# Patient Record
Sex: Female | Born: 1990 | Race: White | Hispanic: No | Marital: Single
Health system: Southern US, Community
[De-identification: ages and names within clinical notes are randomized; demographics above are authoritative.]

---

## 2019-05-19 ENCOUNTER — Other Ambulatory Visit: Payer: Self-pay

## 2019-05-19 ENCOUNTER — Emergency Department (HOSPITAL_COMMUNITY): Payer: Medicaid Other

## 2019-05-19 ENCOUNTER — Emergency Department (HOSPITAL_COMMUNITY)
Admission: EM | Admit: 2019-05-19 | Discharge: 2019-05-19 | Disposition: A | Discharge status: Home / Self Care | Payer: Medicaid Other | Attending: Emergency Medicine | Admitting: Emergency Medicine

## 2019-05-19 DIAGNOSIS — M5489 Other dorsalgia: Secondary | ICD-10-CM | POA: Diagnosis not present

## 2019-05-19 DIAGNOSIS — R05 Cough: Secondary | ICD-10-CM | POA: Diagnosis not present

## 2019-05-19 DIAGNOSIS — Z20828 Contact with and (suspected) exposure to other viral communicable diseases: Secondary | ICD-10-CM | POA: Insufficient documentation

## 2019-05-19 DIAGNOSIS — R059 Cough, unspecified: Secondary | ICD-10-CM

## 2019-05-19 LAB — URINALYSIS, ROUTINE W REFLEX MICROSCOPIC
Bilirubin Urine: NEGATIVE
Glucose, UA: NEGATIVE mg/dL
Hgb urine dipstick: NEGATIVE
Ketones, ur: NEGATIVE mg/dL
Leukocytes,Ua: NEGATIVE
Nitrite: NEGATIVE
Protein, ur: NEGATIVE mg/dL
Specific Gravity, Urine: 1.004 — ABNORMAL LOW (ref 1.005–1.030)
pH: 6 (ref 5.0–8.0)

## 2019-05-19 MED ORDER — ALBUTEROL SULFATE HFA 108 (90 BASE) MCG/ACT IN AERS
2.0000 | INHALATION_SPRAY | RESPIRATORY_TRACT | Status: DC | PRN
  Administered 2019-05-19: 2 via RESPIRATORY_TRACT
  Filled 2019-05-19: qty 6.7

## 2019-05-19 MED ORDER — NAPROXEN 500 MG PO TABS: 500.0000 mg | ORAL_TABLET | Freq: Two times a day (BID) | ORAL | 0 refills | Status: AC | PRN

## 2019-05-19 NOTE — ED Provider Notes (Signed)
[facility]-EMERGENCY DEPT Provider Note   CSN: [id number] Arrival date & time: 05/19/19  0541    History   Chief Complaint Chief Complaint  Patient presents with  . Cough  . Back Pain    HPI Monique Jones is a 28 y.o. female.     The history is provided by the patient and medical records. No language interpreter was used.  Cough Associated symptoms: no chest pain, no chills, no fever, no headaches, no rash, no shortness of breath and no sore throat   Back Pain Associated symptoms: no abdominal pain, no chest pain, no dysuria, no fever and no headaches     Monique Jones is a 28 y.o. female who presents to the Emergency Department complaining of persistent, gradually worsening cough for the last 5 days.  She has tried over-the-counter cough medication with little improvement.  Denies any fever, shortness of breath or chest pain.  No known sick contacts or coronavirus exposures that she is aware of.  Denies any abdominal pain, nausea or vomiting.  She also endorses left-sided back pain for about 3 or 4 days now.  Denies any urinary symptoms or vaginal discharge.  Reports history of pyelonephritis in the past, but denies any chronic medical problems.  Specifically denies any past medical history other than kidney infection.  She takes no daily home medications.   No past medical history on file.  There are no active problems to display for this patient.    OB History   No obstetric history on file.      Home Medications    Prior to Admission medications   Medication Sig Start Date End Date Taking? Authorizing Provider  naproxen (NAPROSYN) 500 MG tablet Take 1 tablet (500 mg total) by mouth 2 (two) times daily as needed for mild pain or moderate pain. 05/19/19   Marliss Buttacavoli, Chase PicketJaime Pilcher, PA-C    Family History No family history on file.  Social History Social History   Tobacco Use  . Smoking status: Not on file  Substance Use Topics  . Alcohol use:  Not on file  . Drug use: Not on file     Allergies   Patient has no known allergies.   Review of Systems Review of Systems  Constitutional: Negative for chills and fever.  HENT: Negative for congestion and sore throat.   Respiratory: Positive for cough. Negative for shortness of breath.   Cardiovascular: Negative for chest pain.  Gastrointestinal: Negative for abdominal pain, diarrhea, nausea and vomiting.  Genitourinary: Negative for difficulty urinating, dysuria, flank pain, frequency, urgency and vaginal discharge.  Musculoskeletal: Positive for back pain. Negative for neck pain.  Skin: Negative for rash.  Allergic/Immunologic: Negative for immunocompromised state.  Neurological: Negative for headaches.     Physical Exam Updated Vital Signs BP 116/75 (BP Location: Left Arm)   Pulse 94   Temp 98.7 F (37.1 C) (Oral)   Resp 15   Ht 5\' 8"  (1.727 m)   Wt 81.1 kg   SpO2 97%   BMI 27.20 kg/m   Physical Exam Vitals signs and nursing note reviewed.  Constitutional:      General: She is not in acute distress.    Appearance: She is well-developed.  HENT:     Head: Normocephalic and atraumatic.     Mouth/Throat:     Pharynx: Oropharynx is clear.  Neck:     Musculoskeletal: Neck supple.  Cardiovascular:     Rate and Rhythm: Normal rate and regular rhythm.  Heart sounds: Normal heart sounds. No murmur.  Pulmonary:     Effort: Pulmonary effort is normal. No respiratory distress.     Breath sounds: Normal breath sounds.     Comments: Lungs clear to auscultation bilaterally. Abdominal:     General: There is no distension.     Palpations: Abdomen is soft.     Comments: No abdominal or flank tenderness.  Musculoskeletal:       Back:     Comments: No midline C/T/L-spine tenderness.  Bilateral lower extremities with 5/5 muscle strength, full range of motion and are neurovascularly intact.  Skin:    General: Skin is warm and dry.  Neurological:     Mental Status:  She is alert and oriented to person, place, and time.      ED Treatments / Results  Labs (all labs ordered are listed, but only abnormal results are displayed) Labs Reviewed  URINALYSIS, ROUTINE W REFLEX MICROSCOPIC - Abnormal; Notable for the following components:      Result Value   Color, Urine STRAW (*)    Specific Gravity, Urine 1.004 (*)    All other components within normal limits  NOVEL CORONAVIRUS, NAA (HOSPITAL ORDER, SEND-OUT TO REF LAB)    EKG None  Radiology Dg Chest Portable 1 View  Result Date: 05/19/2019 CLINICAL DATA:  Pt states that she is hungry and has been walking all day. She endorses a cough with green sputum. Also endorses back pain. EXAM: PORTABLE CHEST 1 VIEW COMPARISON:  None. FINDINGS: Cardiac silhouette is normal in size. No mediastinal or hilar masses. No evidence of adenopathy. Lungs are clear.  No pleural effusion or pneumothorax. Skeletal structures are unremarkable. IMPRESSION: No active disease. Electronically Signed   By: Lajean Manes M.D.   On: 05/19/2019 10:21    Procedures Procedures (including critical care time)  Medications Ordered in ED Medications  albuterol (VENTOLIN HFA) 108 (90 Base) MCG/ACT inhaler 2 puff (2 puffs Inhalation Given 05/19/19 1104)     Initial Impression / Assessment and Plan / ED Course  I have reviewed the triage vital signs and the nursing notes.  Pertinent labs & imaging results that were available during my care of the patient were reviewed by me and considered in my medical decision making (see chart for details).       Monique Jones is a 28 y.o. female who presents to ED for progressively worsening cough x 5 days. On exam, she is afebrile, non-toxic appearing, hemodynamically stable with clear lung exam. Oxygenating well on RA. CXR independently reviewed by me showing no acute cardiopulmonary disease. No PNA. Will treat with albuterol inhaler. PCP follow up. Patient additionally complaining of left sided  low back pain. Exam c/w msk etiology. No urinary symptoms, but does report having back pain in the past which felt similar when diagnosed with pyelonephritis. UA without signs of infection. No abdominal tenderness. No midline tenderness. No red flag symptoms of back pain. Will treat conservatively. Reasons to return to ER discussed. All questions answered.   Final Clinical Impressions(s) / ED Diagnoses   Final diagnoses:  Cough  Left paraspinal back pain    ED Discharge Orders         Ordered    naproxen (NAPROSYN) 500 MG tablet  2 times daily PRN     05/19/19 1232           Lachrista Heslin, Ozella Almond, PA-C 05/19/19 1239    Fredia Sorrow, MD 05/21/19 1536

## 2019-05-19 NOTE — Discharge Instructions (Signed)
It was my pleasure taking care of you today!   2 puffs of inhaler every 4-6 hours as needed for cough.   Naproxen as needed for pain.   You have been tested for coronavirus. Results should be available within 48 hours. You should get a phone call if positive. You can always log in to your mychart account to see results.   Follow up with primary care doctor.   Return to ER for new or worsening symptoms, any additional concerns.

## 2019-05-19 NOTE — ED Notes (Signed)
She rouses enough to cooperate with COVID testing; after which she falls back into a sound sleep. I just now arouse her again to inform her that her hot lunch is here; and again she falls back to sleep. Her skin is normal, warm and dry and she is breathing normally.

## 2019-05-19 NOTE — ED Triage Notes (Signed)
Pt states that she is stranded here for [facility] She states that she is hungry and has been walking all day. She endorses a cough with green sputum. Also endorses back pain. Hx of nephritis.

## 2019-05-20 LAB — NOVEL CORONAVIRUS, NAA (HOSP ORDER, SEND-OUT TO REF LAB; TAT 18-24 HRS): SARS-CoV-2, NAA: NOT DETECTED

## 2020-11-17 IMAGING — DX PORTABLE CHEST - 1 VIEW
1 series · 1 of 1 positions shown · non-contrast
Comparison: None.

CLINICAL DATA: Pt states that she is hungry and has been walking
all day. She endorses a cough with green sputum. Also endorses back
pain.

EXAM:
PORTABLE CHEST 1 VIEW

[chest ap]
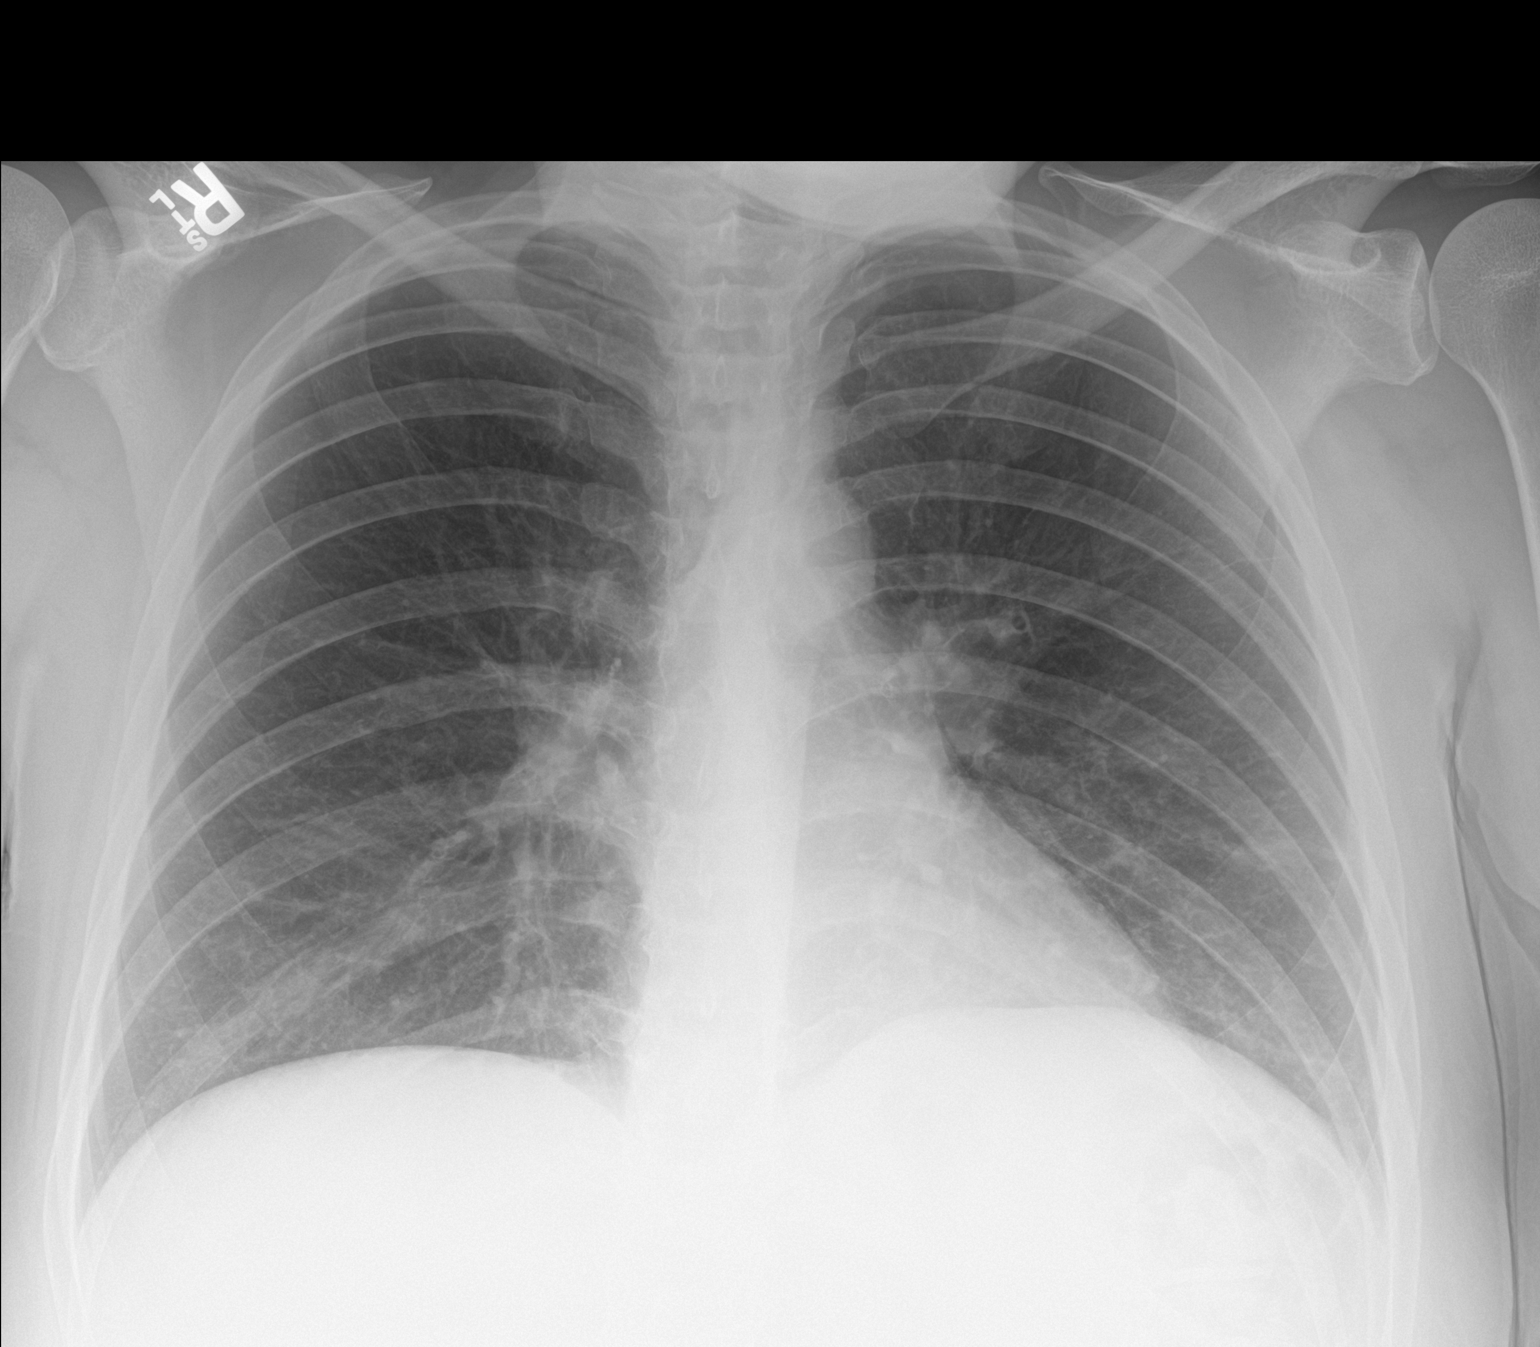

[1 of 1 positions shown; findings below may reference images not displayed]

FINDINGS: Cardiac silhouette is normal in size. No mediastinal or hilar
masses. No evidence of adenopathy.

Lungs are clear.  No pleural effusion or pneumothorax.

Skeletal structures are unremarkable.
IMPRESSION: No active disease.
